# Patient Record
Sex: Female | Born: 1995 | Race: White | Hispanic: No | Marital: Single | Smoking: Never smoker
Health system: Southern US, Community
[De-identification: ages and names within clinical notes are randomized; demographics above are authoritative.]

## PROBLEM LIST (undated history)

## (undated) ENCOUNTER — Ambulatory Visit (HOSPITAL_COMMUNITY): Payer: 59

---
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
## 2019-07-23 ENCOUNTER — Other Ambulatory Visit: Payer: Self-pay

## 2019-07-23 ENCOUNTER — Emergency Department (HOSPITAL_COMMUNITY)
Admission: EM | Admit: 2019-07-23 | Discharge: 2019-07-23 | Discharge status: Against Medical Advice | Payer: 59 | Attending: Emergency Medicine | Admitting: Emergency Medicine

## 2019-07-23 DIAGNOSIS — Z5321 Procedure and treatment not carried out due to patient leaving prior to being seen by health care provider: Secondary | ICD-10-CM | POA: Diagnosis not present

## 2019-07-23 DIAGNOSIS — M549 Dorsalgia, unspecified: Secondary | ICD-10-CM | POA: Diagnosis present

## 2019-07-23 NOTE — ED Notes (Signed)
Pt called for triage x1.  No response.  

## 2019-07-23 NOTE — ED Notes (Signed)
Pt not in lobby.  

## 2019-10-13 ENCOUNTER — Emergency Department (HOSPITAL_COMMUNITY)
Admission: EM | Admit: 2019-10-13 | Discharge: 2019-10-13 | Disposition: A | Discharge status: Other Acute Inpatient Hospital | Payer: 59 | Source: Home / Self Care

## 2019-10-13 ENCOUNTER — Other Ambulatory Visit: Payer: Self-pay

## 2019-10-13 ENCOUNTER — Encounter (HOSPITAL_COMMUNITY): Payer: Self-pay | Admitting: Emergency Medicine

## 2019-10-13 ENCOUNTER — Inpatient Hospital Stay (HOSPITAL_COMMUNITY)
Admission: AD | Admit: 2019-10-13 | Discharge: 2019-10-17 | DRG: 897 | Disposition: A | Discharge status: Home / Self Care | Payer: 59 | Source: Intra-hospital | Attending: Psychiatry | Admitting: Psychiatry

## 2019-10-13 DIAGNOSIS — F1024 Alcohol dependence with alcohol-induced mood disorder: Secondary | ICD-10-CM | POA: Diagnosis present

## 2019-10-13 DIAGNOSIS — F419 Anxiety disorder, unspecified: Secondary | ICD-10-CM | POA: Insufficient documentation

## 2019-10-13 DIAGNOSIS — T543X2A Toxic effect of corrosive alkalis and alkali-like substances, intentional self-harm, initial encounter: Secondary | ICD-10-CM | POA: Insufficient documentation

## 2019-10-13 DIAGNOSIS — G4700 Insomnia, unspecified: Secondary | ICD-10-CM | POA: Diagnosis present

## 2019-10-13 DIAGNOSIS — F332 Major depressive disorder, recurrent severe without psychotic features: Secondary | ICD-10-CM | POA: Diagnosis present

## 2019-10-13 DIAGNOSIS — Z20828 Contact with and (suspected) exposure to other viral communicable diseases: Secondary | ICD-10-CM | POA: Diagnosis present

## 2019-10-13 DIAGNOSIS — T1491XA Suicide attempt, initial encounter: Secondary | ICD-10-CM

## 2019-10-13 DIAGNOSIS — R45851 Suicidal ideations: Secondary | ICD-10-CM | POA: Insufficient documentation

## 2019-10-13 DIAGNOSIS — K219 Gastro-esophageal reflux disease without esophagitis: Secondary | ICD-10-CM | POA: Diagnosis present

## 2019-10-13 DIAGNOSIS — T5492XA Toxic effect of unspecified corrosive substance, intentional self-harm, initial encounter: Secondary | ICD-10-CM

## 2019-10-13 DIAGNOSIS — Y907 Blood alcohol level of 200-239 mg/100 ml: Secondary | ICD-10-CM | POA: Diagnosis present

## 2019-10-13 DIAGNOSIS — F10920 Alcohol use, unspecified with intoxication, uncomplicated: Secondary | ICD-10-CM

## 2019-10-13 LAB — RAPID URINE DRUG SCREEN, HOSP PERFORMED
Amphetamines: NOT DETECTED
Barbiturates: NOT DETECTED
Benzodiazepines: NOT DETECTED
Cocaine: POSITIVE — AB
Opiates: NOT DETECTED
Tetrahydrocannabinol: NOT DETECTED

## 2019-10-13 LAB — ETHANOL: Alcohol, Ethyl (B): 226 mg/dL — ABNORMAL HIGH (ref <10)

## 2019-10-13 LAB — ACETAMINOPHEN LEVEL: Acetaminophen (Tylenol), Serum: <10 ug/mL — ABNORMAL LOW (ref 10–30)

## 2019-10-13 LAB — CBC WITH DIFFERENTIAL/PLATELET
Abs Immature Granulocytes: 0.05 10*3/uL (ref 0.00–0.07)
Basophils Absolute: 0.1 10*3/uL (ref 0.0–0.1)
Basophils Relative: 1 %
Eosinophils Absolute: 0.2 10*3/uL (ref 0.0–0.5)
Eosinophils Relative: 2 %
HCT: 40.3 % (ref 36.0–46.0)
Hemoglobin: 14 g/dL (ref 12.0–15.0)
Immature Granulocytes: 1 %
Lymphocytes Relative: 23 %
Lymphs Abs: 1.9 10*3/uL (ref 0.7–4.0)
MCH: 34.1 pg — ABNORMAL HIGH (ref 26.0–34.0)
MCHC: 34.7 g/dL (ref 30.0–36.0)
MCV: 98.1 fL (ref 80.0–100.0)
Monocytes Absolute: 0.7 10*3/uL (ref 0.1–1.0)
Monocytes Relative: 8 %
Neutro Abs: 5.6 10*3/uL (ref 1.7–7.7)
Neutrophils Relative %: 65 %
Platelets: 211 10*3/uL (ref 150–400)
RBC: 4.11 MIL/uL (ref 3.87–5.11)
RDW: 11.9 % (ref 11.5–15.5)
WBC: 8.5 10*3/uL (ref 4.0–10.5)
nRBC: 0 % (ref 0.0–0.2)

## 2019-10-13 LAB — COMPREHENSIVE METABOLIC PANEL
ALT: 22 U/L (ref 0–44)
AST: 25 U/L (ref 15–41)
Albumin: 4 g/dL (ref 3.5–5.0)
Alkaline Phosphatase: 87 U/L (ref 38–126)
Anion gap: 12 (ref 5–15)
BUN: 11 mg/dL (ref 6–20)
CO2: 23 mmol/L (ref 22–32)
Calcium: 8.7 mg/dL — ABNORMAL LOW (ref 8.9–10.3)
Chloride: 106 mmol/L (ref 98–111)
Creatinine, Ser: 0.82 mg/dL (ref 0.44–1.00)
GFR calc Af Amer: >60 mL/min (ref >60)
GFR calc non Af Amer: >60 mL/min (ref >60)
Glucose, Bld: 98 mg/dL (ref 70–99)
Potassium: 3.5 mmol/L (ref 3.5–5.1)
Sodium: 141 mmol/L (ref 135–145)
Total Bilirubin: 1.1 mg/dL (ref 0.3–1.2)
Total Protein: 7.1 g/dL (ref 6.5–8.1)

## 2019-10-13 LAB — SARS CORONAVIRUS 2 BY RT PCR (HOSPITAL ORDER, PERFORMED IN ~~LOC~~ HOSPITAL LAB): SARS Coronavirus 2: NEGATIVE

## 2019-10-13 LAB — I-STAT BETA HCG BLOOD, ED (MC, WL, AP ONLY): I-stat hCG, quantitative: <5 m[IU]/mL (ref <5)

## 2019-10-13 LAB — SALICYLATE LEVEL: Salicylate Lvl: <7 mg/dL (ref 2.8–30.0)

## 2019-10-13 MED ORDER — FAMOTIDINE 20 MG PO TABS
20.0000 mg | ORAL_TABLET | Freq: Two times a day (BID) | ORAL | Status: DC
  Administered 2019-10-13 (×2): 20 mg via ORAL
  Filled 2019-10-13 (×2): qty 1

## 2019-10-13 MED ORDER — ALUM & MAG HYDROXIDE-SIMETH 200-200-20 MG/5ML PO SUSP
15.0000 mL | Freq: Once | ORAL | Status: AC
  Administered 2019-10-13: 16:00:00 15 mL via ORAL
  Filled 2019-10-13: qty 30

## 2019-10-13 MED ORDER — LORAZEPAM 1 MG PO TABS
1.0000 mg | ORAL_TABLET | Freq: Once | ORAL | Status: AC
  Administered 2019-10-13: 20:00:00 1 mg via ORAL
  Filled 2019-10-13: qty 1

## 2019-10-13 MED ORDER — LORAZEPAM 1 MG PO TABS
1.0000 mg | ORAL_TABLET | Freq: Once | ORAL | Status: AC
  Administered 2019-10-13: 23:00:00 1 mg via ORAL
  Filled 2019-10-13: qty 1

## 2019-10-13 MED ORDER — ACETAMINOPHEN 325 MG PO TABS
650.0000 mg | ORAL_TABLET | Freq: Once | ORAL | Status: AC
  Administered 2019-10-13: 650 mg via ORAL
  Filled 2019-10-13: qty 2

## 2019-10-13 NOTE — ED Notes (Signed)
Mother of patient at SORT. Stated that she called her mother, and stated that she was leaving.

## 2019-10-13 NOTE — Discharge Instructions (Signed)
Admit/transfer to [facility].

## 2019-10-13 NOTE — BH Assessment (Signed)
[facility] Assessment Progress Note Case was staffed with Lord DNP who recommended a inpatient admission to assist with stabilization.       

## 2019-10-13 NOTE — ED Notes (Signed)
Pt on phone with mom

## 2019-10-13 NOTE — ED Provider Notes (Signed)
[facility] EMERGENCY DEPARTMENT Provider Note   CSN: [id number] Arrival date & time: 10/13/19  0524     History   Chief Complaint Chief Complaint - overdose  HPI Margaret Salas is a 23 y.o. female.     The history is provided by the patient.  Drug Overdose This is a new problem. The current episode started 6 to 12 hours ago. The problem has not changed since onset.Pertinent negatives include no chest pain, no abdominal pain, no headaches and no shortness of breath. Nothing aggravates the symptoms. Nothing relieves the symptoms.  Patient presents after excessive alcohol abuse as well as intentional bleach ingestion.  Patient reports she began drinking approximately 4 PM on October 25.  She continued to drink throughout the night, then elected to take a drink of household bleach.  As soon as she swallowed it, she regretted what she did and she induced vomiting.  She now reports a mild sore throat but no other symptoms. She denies any other drug ingestion She reports she has had thoughts of harming self before but this is the first time she has attempted  PMH-none Soc hx - alcohol use Patient Active Problem List   Diagnosis Date Noted  . Sternal fracture 06/10/2016  . Injury of knee, ligament 07/17/2014    History reviewed. No pertinent surgical history.   OB History   No obstetric history on file.      Home Medications    Prior to Admission medications   Medication Sig Start Date End Date Taking? Authorizing Provider  etonogestrel (NEXPLANON) 68 MG IMPL implant Inject 1 each into the skin once.    [provider]    Family History No family history on file.  Social History Social History   Tobacco Use  . Smoking status: Never Smoker  . Smokeless tobacco: Never Used  Substance Use Topics  . Alcohol use: No  . Drug use: No     Allergies   Patient has no known allergies.   Review of Systems Review of Systems  Respiratory:  Negative for shortness of breath.   Cardiovascular: Negative for chest pain.  Gastrointestinal: Negative for abdominal pain.  Neurological: Negative for headaches.  Psychiatric/Behavioral: Positive for suicidal ideas. The patient is nervous/anxious.   All other systems reviewed and are negative.    Physical Exam Updated Vital Signs BP 132/80 (BP Location: Right Arm)   Pulse 95   Temp 98.1 F (36.7 C) (Oral)   Resp (!) 21   SpO2 97%   Physical Exam CONSTITUTIONAL: Mildly anxious, no acute distress HEAD: Normocephalic/atraumatic EYES: EOMI/PERRL ENMT: Mucous membranes moist, uvula midline without exudates, mild erythema, no drooling, no stridor.  No dysphonia NECK: supple no meningeal signs SPINE/BACK:entire spine nontender CV: S1/S2 noted, no murmurs/rubs/gallops noted LUNGS: Lungs are clear to auscultation bilaterally, no apparent distress ABDOMEN: soft, nontender NEURO: Pt is awake/alert/appropriate, moves all extremitiesx4.  No facial droop.   EXTREMITIES: pulses normal/equal, full ROM SKIN: warm, color normal PSYCH: Anxious   ED Treatments / Results  Labs (all labs ordered are listed, but only abnormal results are displayed) Labs Reviewed  CBC WITH DIFFERENTIAL/PLATELET  COMPREHENSIVE METABOLIC PANEL  RAPID URINE DRUG SCREEN, HOSP PERFORMED  ETHANOL  SALICYLATE LEVEL  ACETAMINOPHEN LEVEL  I-STAT BETA HCG BLOOD, ED ([facility], [facility], [facility] ONLY)    EKG EKG Interpretation  Date/Time:  Monday October 13 2019 06:11:25 EDT Ventricular Rate:  90 PR Interval:    QRS Duration: 98 QT Interval:  359  QTC Calculation: 440 R Axis:   68 Text Interpretation:  Sinus rhythm Interpretation limited secondary to artifact No significant change since last tracing Confirmed by Ripley Fraise [phone]) on 10/13/2019 6:18:05 AM   Radiology No results found.  Procedures Procedures   Medications Ordered in ED Medications - No data to display   Initial Impression / Assessment and Plan  / ED Course  I have reviewed the triage vital signs and the nursing notes.  Pertinent labs & imaging results that were available during my care of the patient were reviewed by me and considered in my medical decision making (see chart for details).        5:49 AM Patient presents after binge drinking and then having suicidal thoughts and drinking bleach.  After discussion with poison center, since this was 1 drink of household bleach, she can take p.o. fluids she is likely cleared after getting appropriate screening labs including a 4-hour Tylenol level 6:18 AM Patient was able to drink p.o. fluids. There is not a definitive time when she drank the bleach, but at some point prior to arrival We will check a 4-hour Tylenol level at 8 AM 6:56 AM Signed out to Dr Johnney Killian with labs pending then psych consult.  Final Clinical Impressions(s) / ED Diagnoses   Final diagnoses:  Suicide attempt ([place])  Ingestion of bleach, intentional self-harm, initial encounter [facility])    ED Discharge Orders    None       Ripley Fraise, MD 10/13/19 [phone]

## 2019-10-13 NOTE — ED Notes (Signed)
IVc paperwork given to [facility] for him to fill out.

## 2019-10-13 NOTE — ED Notes (Signed)
Lunch tray ordered, pt given water.

## 2019-10-13 NOTE — ED Notes (Signed)
Notified the MD that the patient is wanting to leave and that her mother is out front trying to take her home.

## 2019-10-13 NOTE — ED Notes (Signed)
Regular breakfast tray ordered.  

## 2019-10-13 NOTE — ED Notes (Signed)
ORDERED DIET TRAY FOR PT  

## 2019-10-13 NOTE — ED Provider Notes (Signed)
[facility] team had evaluated and recommended inpatient psychiatric treatment. Awaiting bed.  I was notified by nursing that patient was wanting/trying to leave ED.   I went and re-evaluated patient x 3 times. Patient acknowledges worsening, severe feelings of depression over course of past few weeks, and that last night, early this AM, suicidal thoughts, drank bleach in attempt to harm self. Also having problems with intermittent binge drinking 'to try to take away the pain of depression'.  I encouraged patient to stay voluntarily, and explained x 3, that in interest of her safety and well-being, that we would have to do ivc paperwork should she not stay voluntarily.   BH was re-consulted to reassess.   While waiting [place] reassessment, patient tried to leave ED.   IVC papers completed given severe depression, SI, and attempt to harm self.   [facility] recontacted - they indicate they have bed available.   COVID swab sent.   Will plan transport to [facility] once covid resulted.      Lajean Saver, MD 10/13/19 2112

## 2019-10-13 NOTE — ED Notes (Signed)
Pt requesting anxiety medication, MD Steinl notified, pt given coloring book and sandwich.

## 2019-10-13 NOTE — ED Notes (Signed)
[employer] called for transport 

## 2019-10-13 NOTE — ED Provider Notes (Signed)
Patient is signed out from Dr. Raechel Chute care.  She drank a small amount of bleach.  She had ingested a large amount of alcohol.  Plan is to observe the patient and review labs.  If no difficulty with swallowing or symptomatic after several hours observation, per poison control safe for medical clearance and psychiatric consultation. Physical Exam  BP 132/80 (BP Location: Right Arm)   Pulse 95   Temp 98.1 F (36.7 C) (Oral)   Resp (!) 21   SpO2 97%   Physical Exam Patient is resting quietly.  As I approach she awakens to light voice.  She is oriented and speech is clear.  Voice is normal.  She has been able to tolerate liquids.  She has had no vomiting.  She does not have ongoing pain. ED Course/Procedures     Procedures  MDM  Patient is medically cleared for psychiatric evaluation.  Psychiatric hold orders placed.       Charlesetta Shanks, MD 10/13/19 1209

## 2019-10-13 NOTE — ED Notes (Signed)
IVC paperwork faxed to [facility]. Original placed in red folder in purple med room.

## 2019-10-13 NOTE — BH Assessment (Addendum)
Assessment Note  Margaret Salas is an 23 y.o. female that presents this date with S/I. Patient was noted to have ingested bleach prior to arrival in an attempt to self harm. Patient reports this was an attempt at taking her life. Patient denies any prior attempts or gestures. Patient denies any H/I or AVH. Patient cannot identify any immediate stressors stating "it's everything." Patient reports she is currently unemployed although states she is suppose to start a new job later this week at "[employer] Patient denies any prior history of inpatient hospitalizations associated with mental health or having a current OP provider. Patient states she was diagnosed with depression "years ago" by her PCP although denies any past or current medication interventions to assist with symptom management. Patient reports ongoing symptoms to include: feeling useless, excessive fatigue and isolating. Patient states she currently resides with a roommate and lacks social supports in the area. Patient denies any access to weapons or current legal issues. Patient reports ongoing SA issues to include daily use of alcohol for the last six months stating she consumes up to one half pint of liquor with last use on 10/12/19 when she reported she consumed "a few drinks." Patient denies any other SA use although tested positive for cocaine this date. Patient denies any current withdrawals. History is limited per chart review. Per notes this date patient presents after excessive alcohol abuse as well as intentional bleach ingestion. Patient reports she began drinking approximately 4 PM on October 25.  She continued to drink throughout the night, then elected to take a drink of household bleach. As soon as she swallowed it, she regretted what she did and she induced vomiting. She reports she has had thoughts of harming self before but this is the first time she has attempted. Patient's BAL was 226 on arrival.  Patient is dressed in  hospital scrubs, alert and oriented person and place. Patient speaks in a clear tone, at moderate volume and normal pace. Motor behavior appears normal. Eye contact is staring. Patient's mood is depressed and affect is blunted. Thought process is organized although insight and judgment are poor. Patient says she is willing to sign voluntarily into a psychiatric facility. Case was staffed with Shaune PollackLord DNP who recommended a inpatient admission to assist with stabilization.      Diagnosis: F33.2 MDD recurrent without psychotic features, severe, Alcohol abuse, Cocaine abuse  Past Medical History: History reviewed. No pertinent past medical history.  History reviewed. No pertinent surgical history.  Family History: No family history on file.  Social History:  reports that she has never smoked. She has never used smokeless tobacco. She reports that she does not drink alcohol or use drugs.  Additional Social History:  Alcohol / Drug Use Pain Medications: See MAR Prescriptions: See MAR Over the Counter: See MAR History of alcohol / drug use?: Yes Longest period of sobriety (when/how long): Unknown Negative Consequences of Use: (Denies) Withdrawal Symptoms: (Denies) Substance #1 Name of Substance 1: Alcohol 1 - Age of First Use: 18 1 - Amount (size/oz): Varies 1 - Frequency: Varies 1 - Duration: Ongoing 1 - Last Use / Amount: 10/12/19 1 pint of liquor Substance #2 Name of Substance 2: Cocaine 2 - Age of First Use: 22 2 - Amount (size/oz): Varies 2 - Frequency: Varies 2 - Duration: Ongoing 2 - Last Use / Amount: 10/12/19 1 gram  CIWA: CIWA-Ar BP: 129/60 Pulse Rate: 66 COWS:    Allergies: No Known Allergies  Home Medications: (  Not in a hospital admission)   OB/GYN Status:  No LMP recorded. Patient has had an implant.  General Assessment Data Location of Assessment: [facility] ED TTS Assessment: In system Is this a Tele or Face-to-Face Assessment?: Face-to-Face Is this an Initial  Assessment or a Re-assessment for this encounter?: Initial Assessment Patient Accompanied by:: N/A Language Other than English: No Living Arrangements: Other (Comment) What gender do you identify as?: Female Marital status: Single Maiden name: Rettig Pregnancy Status: No Living Arrangements: Non-relatives/Friends Can pt return to current living arrangement?: Yes Admission Status: Voluntary Is patient capable of signing voluntary admission?: Yes Referral Source: Self/Family/Friend Insurance type: Research officer, trade union Exam [facility] Walk-in ONLY) Medical Exam completed: Yes  Crisis Care Plan Living Arrangements: Non-relatives/Friends Legal Guardian: (NA) Name of Psychiatrist: None Name of Therapist: None  Education Status Is patient currently in school?: No Is the patient employed, unemployed or receiving disability?: Unemployed  Risk to self with the past 6 months Suicidal Ideation: Yes-Currently Present Has patient been a risk to self within the past 6 months prior to admission? : No Suicidal Intent: Yes-Currently Present Has patient had any suicidal intent within the past 6 months prior to admission? : No Is patient at risk for suicide?: Yes Suicidal Plan?: Yes-Currently Present Has patient had any suicidal plan within the past 6 months prior to admission? : No Specify Current Suicidal Plan: Drink bleach Access to Means: Yes Specify Access to Suicidal Means: Pt drank bleach What has been your use of drugs/alcohol within the last 12 months?: Current use Previous Attempts/Gestures: No How many times?: 0 Other Self Harm Risks: (Excessive SA use) Triggers for Past Attempts: (NA) Intentional Self Injurious Behavior: None Family Suicide History: No Recent stressful life event(s): Other (Comment)(Ongoing SA issues) Persecutory voices/beliefs?: No Depression: Yes Depression Symptoms: Feeling worthless/self pity Substance abuse history and/or treatment for  substance abuse?: No Suicide prevention information given to non-admitted patients: Not applicable  Risk to Others within the past 6 months Homicidal Ideation: No Does patient have any lifetime risk of violence toward others beyond the six months prior to admission? : No Thoughts of Harm to Others: No Current Homicidal Intent: No Current Homicidal Plan: No Access to Homicidal Means: No Identified Victim: NA History of harm to others?: No Assessment of Violence: None Noted Violent Behavior Description: NA Does patient have access to weapons?: No Criminal Charges Pending?: No Does patient have a court date: No Is patient on probation?: No  Psychosis Hallucinations: None noted Delusions: None noted  Mental Status Report Appearance/Hygiene: Unremarkable Eye Contact: Fair Motor Activity: Freedom of movement Speech: Logical/coherent Level of Consciousness: Quiet/awake Mood: Depressed Affect: Appropriate to circumstance Anxiety Level: Minimal Thought Processes: Coherent, Relevant Judgement: Partial Orientation: Person, Place, Time Obsessive Compulsive Thoughts/Behaviors: None  Cognitive Functioning Concentration: Normal Memory: Recent Intact, Remote Intact Is patient IDD: No Insight: Fair Impulse Control: Poor Appetite: Fair Have you had any weight changes? : No Change Sleep: No Change Total Hours of Sleep: 7 Vegetative Symptoms: None  ADLScreening [facility] Assessment Services) Patient's cognitive ability adequate to safely complete daily activities?: Yes Patient able to express need for assistance with ADLs?: Yes Independently performs ADLs?: Yes (appropriate for developmental age)  Prior Inpatient Therapy Prior Inpatient Therapy: No  Prior Outpatient Therapy Prior Outpatient Therapy: No Does patient have an ACCT team?: No Does patient have Intensive In-House Services?  : No Does patient have [facility] services? : No Does patient have P4CC services?: No  ADL  Screening (condition at time  of admission) Patient's cognitive ability adequate to safely complete daily activities?: Yes Is the patient deaf or have difficulty hearing?: No Does the patient have difficulty seeing, even when wearing glasses/contacts?: No Does the patient have difficulty concentrating, remembering, or making decisions?: No Patient able to express need for assistance with ADLs?: Yes Does the patient have difficulty dressing or bathing?: No Independently performs ADLs?: Yes (appropriate for developmental age) Does the patient have difficulty walking or climbing stairs?: No Weakness of Legs: None Weakness of Arms/Hands: None  Home Assistive Devices/Equipment Home Assistive Devices/Equipment: None  Therapy Consults (therapy consults require a physician order) PT Evaluation Needed: No OT Evalulation Needed: No SLP Evaluation Needed: No Abuse/Neglect Assessment (Assessment to be complete while patient is alone) Abuse/Neglect Assessment Can Be Completed: Yes Physical Abuse: Denies Verbal Abuse: Denies Sexual Abuse: Denies Exploitation of patient/patient's resources: Denies Self-Neglect: Denies Values / Beliefs Cultural Requests During Hospitalization: None Spiritual Requests During Hospitalization: None Consults Spiritual Care Consult Needed: No Social Work Consult Needed: No Regulatory affairs officer (For Healthcare) Does Patient Have a Medical Advance Directive?: No Would patient like information on creating a medical advance directive?: No - Patient declined          Disposition: Case was staffed with Reita Cliche DNP who recommended a inpatient admission to assist with stabilization.      Disposition Initial Assessment Completed for this Encounter: Yes Disposition of Patient: Admit Type of inpatient treatment program: Adult Patient refused recommended treatment: No  On Site Evaluation by:   Reviewed with Physician:    Mamie Nick 10/13/2019 1:38 PM

## 2019-10-13 NOTE — ED Notes (Signed)
Pt attempted to leave and was brought back by security and [employer], pt informed that she is IVC'd, pt very tearful but cooperative, pt informed that she has a bed at [facility] pending her COVID test.  [employer] and sitter to remain near bed side. Will continue to monitor. Mother left premises after security informed her she could no longer stay.

## 2019-10-13 NOTE — ED Notes (Signed)
pts belongings are in locker #4. Wallet and cell phone were given to security.

## 2019-10-13 NOTE — ED Notes (Signed)
Her mom brought her a card

## 2019-10-13 NOTE — ED Notes (Signed)
Sheriff here to transport 

## 2019-10-13 NOTE — BH Assessment (Signed)
Patient accepted to [facility] pending a negative COVID test. The room assignment is 301-1. Patient accepted by Waylan Boga, NP. The attending provider is Dr. Parke Poisson. Nursing report [phone].

## 2019-10-13 NOTE — ED Notes (Signed)
REGULAR LUNCH TRAY ORDERED 

## 2019-10-13 NOTE — ED Triage Notes (Signed)
Pt here after drinking all day and decided to drink some bleach in an SI attempt , pt has a hx of depression but does not take meds

## 2019-10-13 NOTE — ED Notes (Signed)
Spoke with poison control, no further recommendations given at this time.

## 2019-10-13 NOTE — ED Notes (Signed)
MD Steinl aware IVC paperwork needs to be completed.

## 2019-10-14 ENCOUNTER — Encounter (HOSPITAL_COMMUNITY): Payer: Self-pay | Admitting: *Deleted

## 2019-10-14 ENCOUNTER — Other Ambulatory Visit: Payer: Self-pay

## 2019-10-14 DIAGNOSIS — F1024 Alcohol dependence with alcohol-induced mood disorder: Secondary | ICD-10-CM | POA: Diagnosis not present

## 2019-10-14 DIAGNOSIS — T1491XA Suicide attempt, initial encounter: Secondary | ICD-10-CM

## 2019-10-14 DIAGNOSIS — T5492XA Toxic effect of unspecified corrosive substance, intentional self-harm, initial encounter: Secondary | ICD-10-CM

## 2019-10-14 DIAGNOSIS — F332 Major depressive disorder, recurrent severe without psychotic features: Secondary | ICD-10-CM | POA: Diagnosis not present

## 2019-10-14 LAB — LIPID PANEL
Cholesterol: 187 mg/dL (ref 0–200)
HDL: 99 mg/dL (ref >40)
LDL Cholesterol: 67 mg/dL (ref 0–99)
Total CHOL/HDL Ratio: 1.9 RATIO
Triglycerides: 104 mg/dL (ref <150)
VLDL: 21 mg/dL (ref 0–40)

## 2019-10-14 LAB — TSH: TSH: 2.038 u[IU]/mL (ref 0.350–4.500)

## 2019-10-14 LAB — HEMOGLOBIN A1C
Hgb A1c MFr Bld: 4.5 % — ABNORMAL LOW (ref 4.8–5.6)
Mean Plasma Glucose: 82.45 mg/dL

## 2019-10-14 MED ORDER — FAMOTIDINE 20 MG PO TABS
20.0000 mg | ORAL_TABLET | Freq: Two times a day (BID) | ORAL | Status: DC
  Administered 2019-10-14 – 2019-10-17 (×7): 20 mg via ORAL
  Filled 2019-10-14 (×11): qty 1

## 2019-10-14 MED ORDER — ADULT MULTIVITAMIN W/MINERALS CH
1.0000 | ORAL_TABLET | Freq: Every day | ORAL | Status: DC
  Administered 2019-10-14 – 2019-10-17 (×4): 1 via ORAL
  Filled 2019-10-14 (×6): qty 1

## 2019-10-14 MED ORDER — ACETAMINOPHEN 325 MG PO TABS: 650.0000 mg | ORAL_TABLET | Freq: Four times a day (QID) | ORAL | Status: DC | PRN

## 2019-10-14 MED ORDER — VITAMIN B-1 100 MG PO TABS
100.0000 mg | ORAL_TABLET | Freq: Every day | ORAL | Status: DC
  Administered 2019-10-15 – 2019-10-17 (×3): 100 mg via ORAL
  Filled 2019-10-14 (×5): qty 1

## 2019-10-14 MED ORDER — CHLORDIAZEPOXIDE HCL 25 MG PO CAPS
25.0000 mg | ORAL_CAPSULE | ORAL | Status: AC
  Administered 2019-10-16 (×2): 25 mg via ORAL
  Filled 2019-10-14 (×2): qty 1

## 2019-10-14 MED ORDER — CHLORDIAZEPOXIDE HCL 25 MG PO CAPS
25.0000 mg | ORAL_CAPSULE | Freq: Four times a day (QID) | ORAL | Status: AC
  Administered 2019-10-14 (×4): 25 mg via ORAL
  Filled 2019-10-14 (×4): qty 1

## 2019-10-14 MED ORDER — CHLORDIAZEPOXIDE HCL 25 MG PO CAPS: 25.0000 mg | ORAL_CAPSULE | Freq: Four times a day (QID) | ORAL | Status: AC | PRN

## 2019-10-14 MED ORDER — ONDANSETRON 4 MG PO TBDP: 4.0000 mg | ORAL_TABLET | Freq: Four times a day (QID) | ORAL | Status: AC | PRN

## 2019-10-14 MED ORDER — LOPERAMIDE HCL 2 MG PO CAPS: 2.0000 mg | ORAL_CAPSULE | ORAL | Status: AC | PRN

## 2019-10-14 MED ORDER — HYDROXYZINE HCL 25 MG PO TABS
25.0000 mg | ORAL_TABLET | Freq: Four times a day (QID) | ORAL | Status: AC | PRN
  Administered 2019-10-15 (×2): 25 mg via ORAL
  Filled 2019-10-14 (×2): qty 1

## 2019-10-14 MED ORDER — TRAZODONE HCL 50 MG PO TABS
50.0000 mg | ORAL_TABLET | Freq: Every evening | ORAL | Status: DC | PRN
  Administered 2019-10-14 – 2019-10-16 (×4): 50 mg via ORAL
  Filled 2019-10-14 (×4): qty 1

## 2019-10-14 MED ORDER — CHLORDIAZEPOXIDE HCL 25 MG PO CAPS
25.0000 mg | ORAL_CAPSULE | Freq: Three times a day (TID) | ORAL | Status: AC
  Administered 2019-10-15 (×3): 25 mg via ORAL
  Filled 2019-10-14 (×3): qty 1

## 2019-10-14 MED ORDER — FLUOXETINE HCL 10 MG PO CAPS
10.0000 mg | ORAL_CAPSULE | Freq: Every day | ORAL | Status: DC
  Administered 2019-10-14 – 2019-10-15 (×2): 10 mg via ORAL
  Filled 2019-10-14 (×5): qty 1

## 2019-10-14 MED ORDER — ALUM & MAG HYDROXIDE-SIMETH 200-200-20 MG/5ML PO SUSP: 30.0000 mL | ORAL | Status: DC | PRN

## 2019-10-14 MED ORDER — HYDROXYZINE HCL 25 MG PO TABS: 25.0000 mg | ORAL_TABLET | Freq: Three times a day (TID) | ORAL | Status: DC | PRN

## 2019-10-14 MED ORDER — CHLORDIAZEPOXIDE HCL 25 MG PO CAPS
25.0000 mg | ORAL_CAPSULE | Freq: Every day | ORAL | Status: AC
  Administered 2019-10-17: 25 mg via ORAL
  Filled 2019-10-14: qty 1

## 2019-10-14 MED ORDER — MAGNESIUM HYDROXIDE 400 MG/5ML PO SUSP: 30.0000 mL | Freq: Every day | ORAL | Status: DC | PRN
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
## 2019-10-14 NOTE — Progress Notes (Signed)
Patient shared in group that her positive event for the day is that she had a chance to get to know her peers. Her goal for tomorrow is that she would like to work on her discharge plans.
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
## 2019-10-14 NOTE — Progress Notes (Signed)
Pt was pleasant and cooperative during the adm process. Per note this is the pt's first inpatient admission. When asked about family history pt stated, "my whole family are alcoholics". Pt denies SI now, but drank bleach several hours prior to her bhh admission. Pt's BAL was 256, stated she drinks almost every day. Writer spoke to the NP and pt will be started on a detox protocol. Pt denies SI, HI, and A/V at this time

## 2019-10-14 NOTE — Tx Team (Signed)
Initial Treatment Plan 10/14/2019 1:49 AM Margaret Salas [facility]:[id number]    PATIENT STRESSORS: Financial difficulties Occupational concerns Substance abuse Other: Mother drinks too much   PATIENT STRENGTHS: Ability for insight Active sense of humor Average or above average intelligence Capable of independent living Communication skills General fund of knowledge Motivation for treatment/growth Physical Health Special hobby/interest Supportive family/friends Work skills   PATIENT IDENTIFIED PROBLEMS: "Like learn how to cope with other ways than drinking"    "Learn how to control depression, other than let it break you down"    Increased risk for suicide  Substance abuse           DISCHARGE CRITERIA:  Ability to meet basic life and health needs Adequate post-discharge living arrangements Improved stabilization in mood, thinking, and/or behavior Motivation to continue treatment in a less acute level of care Need for constant or close observation no longer present Reduction of life-threatening or endangering symptoms to within safe limits Safe-care adequate arrangements made Verbal commitment to aftercare and medication compliance Withdrawal symptoms are absent or subacute and managed without 24-hour nursing intervention  PRELIMINARY DISCHARGE PLAN: Attend 12-step recovery group Outpatient therapy Participate in family therapy Placement in alternative living arrangements Return to previous living arrangement  PATIENT/FAMILY INVOLVEMENT: This treatment plan has been presented to and reviewed with the patient, Margaret Salas, and/or family member.  The patient and family have been given the opportunity to ask questions and make suggestions.  Doran Heater, RN 10/14/2019, 1:49 AM

## 2019-10-14 NOTE — Progress Notes (Signed)
DAR Note:  D - Pt denies SI, HI, A/V Hallucinations. Pt is alert and oriented x 4.  Pt states that she is depressed and anxious.  Pt rates anxiety at a 7 out of 10. Pt reports that she slept well last night and that Trazodone was effective.  Pt states that her goal for today  is to "relax" and "not stress".   A - RN provided emotional support and reassurance. RN provided education to pt regarding pt's medications.   RN administered pt's medications as prescribed.  RN maintained safety protocols with q 15 minute safety checks.  RN asked pt to notify staff if pt is in need of assistance.  R - Pt is interacting well with her peers, and she  is seen smiling throughout the day thus far. Pt states that librium is helping pt to feel less anxious.   Pt reports that she has no needs or concerns at this time.
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
## 2019-10-14 NOTE — [facility] Suicide Risk Assessment (Signed)
[place] INPATIENT:  Family/Significant Other Suicide Prevention Education  Suicide Prevention Education:  Patient Refusal for Family/Significant Other Suicide Prevention Education: The patient Margaret Salas has refused to provide written consent for family/significant other to be provided Family/Significant Other Suicide Prevention Education during admission and/or prior to discharge.  Physician notified.  SPE completed with pt, as pt refused to consent to family contact. SPI pamphlet provided to pt and pt was encouraged to share information with support network, ask questions, and talk about any concerns relating to SPE. Pt denies access to guns/firearms and verbalized understanding of information provided. Mobile Crisis information also provided to pt.   Rozann Lesches 10/14/2019, 10:12 AM

## 2019-10-15 DIAGNOSIS — F332 Major depressive disorder, recurrent severe without psychotic features: Secondary | ICD-10-CM | POA: Diagnosis not present

## 2019-10-15 DIAGNOSIS — F1024 Alcohol dependence with alcohol-induced mood disorder: Secondary | ICD-10-CM | POA: Diagnosis not present

## 2019-10-15 DIAGNOSIS — T5492XA Toxic effect of unspecified corrosive substance, intentional self-harm, initial encounter: Secondary | ICD-10-CM | POA: Diagnosis not present

## 2019-10-15 DIAGNOSIS — T1491XA Suicide attempt, initial encounter: Secondary | ICD-10-CM | POA: Diagnosis not present

## 2019-10-15 MED ORDER — NICOTINE 7 MG/24HR TD PT24
7.0000 mg | MEDICATED_PATCH | Freq: Every day | TRANSDERMAL | Status: DC
  Administered 2019-10-15 – 2019-10-16 (×2): 7 mg via TRANSDERMAL
  Filled 2019-10-15 (×6): qty 1

## 2019-10-15 MED ORDER — ACAMPROSATE CALCIUM 333 MG PO TBEC
666.0000 mg | DELAYED_RELEASE_TABLET | Freq: Three times a day (TID) | ORAL | Status: DC
  Administered 2019-10-15 – 2019-10-17 (×7): 666 mg via ORAL
  Filled 2019-10-15 (×12): qty 2

## 2019-10-15 MED ORDER — FLUOXETINE HCL 20 MG PO CAPS
20.0000 mg | ORAL_CAPSULE | Freq: Every day | ORAL | Status: DC
  Administered 2019-10-16 – 2019-10-17 (×2): 20 mg via ORAL
  Filled 2019-10-15 (×4): qty 1

## 2019-10-15 NOTE — Progress Notes (Signed)
D:  Patient denied SI and HI, contracts for safety.  Denied A/V hallucinations.   A:  Medications administered per MD orders.  Emotional support and encouragement given patient. R:  Safety maintained with 15 minute checks.  

## 2019-10-15 NOTE — Tx Team (Signed)
Interdisciplinary Treatment and Diagnostic Plan Update  10/15/2019 Time of Session: 9:00am Margaret Salas MRN: [id number]  Principal Diagnosis: <principal problem not specified>  Secondary Diagnoses: Active Problems:   Severe recurrent major depression without psychotic features ([place])   Alcohol dependence with alcohol-induced mood disorder ([facility])   Current Medications:  Current Facility-Administered Medications  Medication Dose Route Frequency Provider Last Rate Last Dose  . acetaminophen (TYLENOL) tablet 650 mg  650 mg Oral Q6H PRN Rozetta Nunnery, NP      . alum & mag hydroxide-simeth (MAALOX/MYLANTA) 200-200-20 MG/5ML suspension 30 mL  30 mL Oral Q4H PRN Lindon Romp A, NP      . chlordiazePOXIDE (LIBRIUM) capsule 25 mg  25 mg Oral Q6H PRN Lindon Romp A, NP      . chlordiazePOXIDE (LIBRIUM) capsule 25 mg  25 mg Oral TID Lindon Romp A, NP   25 mg at 10/15/19 0900   Followed by  . [START ON 10/16/2019] chlordiazePOXIDE (LIBRIUM) capsule 25 mg  25 mg Oral BH-qamhs Rozetta Nunnery, NP       Followed by  . [START ON 10/17/2019] chlordiazePOXIDE (LIBRIUM) capsule 25 mg  25 mg Oral Daily Lindon Romp A, NP      . famotidine (PEPCID) tablet 20 mg  20 mg Oral BID Lindon Romp A, NP   20 mg at 10/15/19 0900  . FLUoxetine (PROZAC) capsule 10 mg  10 mg Oral Daily Connye Burkitt, NP   10 mg at 10/15/19 0900  . hydrOXYzine (ATARAX/VISTARIL) tablet 25 mg  25 mg Oral Q6H PRN Lindon Romp A, NP      . loperamide (IMODIUM) capsule 2-4 mg  2-4 mg Oral PRN Lindon Romp A, NP      . magnesium hydroxide (MILK OF MAGNESIA) suspension 30 mL  30 mL Oral Daily PRN Lindon Romp A, NP      . multivitamin with minerals tablet 1 tablet  1 tablet Oral Daily Lindon Romp A, NP   1 tablet at 10/15/19 0900  . ondansetron (ZOFRAN-ODT) disintegrating tablet 4 mg  4 mg Oral Q6H PRN Lindon Romp A, NP      . thiamine (VITAMIN B-1) tablet 100 mg  100 mg Oral Daily Lindon Romp A, NP   100 mg at 10/15/19 0900  . traZODone  (DESYREL) tablet 50 mg  50 mg Oral QHS PRN Rozetta Nunnery, NP   50 mg at 10/14/19 2157   PTA Medications: Medications Prior to Admission  Medication Sig Dispense Refill Last Dose  . etonogestrel (NEXPLANON) 68 MG IMPL implant Inject 1 each into the skin once.       Patient Stressors: Financial difficulties Occupational concerns Substance abuse Other: Mother drinks too much  Patient Strengths: Ability for insight Active sense of humor Average or above average intelligence Capable of independent living Communication skills General fund of knowledge Motivation for treatment/growth Physical Health Special hobby/interest Supportive family/friends Work skills  Treatment Modalities: Medication Management, Group therapy, Case management,  1 to 1 session with clinician, Psychoeducation, Recreational therapy.   Physician Treatment Plan for Primary Diagnosis: <principal problem not specified> Long Term Goal(s): Improvement in symptoms so as ready for discharge Improvement in symptoms so as ready for discharge   Short Term Goals: Ability to identify changes in lifestyle to reduce recurrence of condition will improve Ability to verbalize feelings will improve Ability to disclose and discuss suicidal ideas Ability to demonstrate self-control will improve Ability to identify and develop effective coping behaviors will improve  Medication Management: Evaluate patient's response, side effects, and tolerance of medication regimen.  Therapeutic Interventions: 1 to 1 sessions, Unit Group sessions and Medication administration.  Evaluation of Outcomes: Not Met  Physician Treatment Plan for Secondary Diagnosis: Active Problems:   Severe recurrent major depression without psychotic features ([place])   Alcohol dependence with alcohol-induced mood disorder ([place])  Long Term Goal(s): Improvement in symptoms so as ready for discharge Improvement in symptoms so as ready for discharge   Short Term  Goals: Ability to identify changes in lifestyle to reduce recurrence of condition will improve Ability to verbalize feelings will improve Ability to disclose and discuss suicidal ideas Ability to demonstrate self-control will improve Ability to identify and develop effective coping behaviors will improve     Medication Management: Evaluate patient's response, side effects, and tolerance of medication regimen.  Therapeutic Interventions: 1 to 1 sessions, Unit Group sessions and Medication administration.  Evaluation of Outcomes: Not Met   RN Treatment Plan for Primary Diagnosis: <principal problem not specified> Long Term Goal(s): Knowledge of disease and therapeutic regimen to maintain health will improve  Short Term Goals: Ability to participate in decision making will improve, Ability to verbalize feelings will improve, Ability to disclose and discuss suicidal ideas, Ability to identify and develop effective coping behaviors will improve and Compliance with prescribed medications will improve  Medication Management: RN will administer medications as ordered by provider, will assess and evaluate patient's response and provide education to patient for prescribed medication. RN will report any adverse and/or side effects to prescribing provider.  Therapeutic Interventions: 1 on 1 counseling sessions, Psychoeducation, Medication administration, Evaluate responses to treatment, Monitor vital signs and CBGs as ordered, Perform/monitor CIWA, COWS, AIMS and Fall Risk screenings as ordered, Perform wound care treatments as ordered.  Evaluation of Outcomes: Not Met   LCSW Treatment Plan for Primary Diagnosis: <principal problem not specified> Long Term Goal(s): Safe transition to appropriate next level of care at discharge, Engage patient in therapeutic group addressing interpersonal concerns.  Short Term Goals: Engage patient in aftercare planning with referrals and resources  Therapeutic  Interventions: Assess for all discharge needs, 1 to 1 time with Social worker, Explore available resources and support systems, Assess for adequacy in community support network, Educate family and significant other(s) on suicide prevention, Complete Psychosocial Assessment, Interpersonal group therapy.  Evaluation of Outcomes: Not Met   Progress in Treatment: Attending groups: No. Participating in groups: No. Taking medication as prescribed: Yes. Toleration medication: Yes. Family/Significant other contact made: No, will contact:  no one, patient denies consent Patient understands diagnosis: Yes. Discussing patient identified problems/goals with staff: Yes. Medical problems stabilized or resolved: Yes. Denies suicidal/homicidal ideation: Yes. Issues/concerns per patient self-inventory: No. Other:  New problem(s) identified: None   New Short Term/Long Term Goal(s):Detox, medication stabilization, elimination of SI thoughts, development of comprehensive mental wellness plan.    Patient Goals:  "Trying to get sober and not be depressed"   Discharge Plan or Barriers: Patient plans to return home with her best friend. She expressed interest in outpatient medication management and therapy services at [facility] at discharge. CSW will continue to follow for appropriate referrals and discharge planning.   Reason for Continuation of Hospitalization: Anxiety Depression Medication stabilization Suicidal ideation  Estimated Length of Stay:3-5 days   Attendees: Patient: Margaret Salas  10/15/2019 9:38 AM  Physician: Dr. Neita Garnet, MD 10/15/2019 9:38 AM  Nursing: Rise Paganini.Raliegh Ip, RN 10/15/2019 9:38 AM  RN Care Manager: 10/15/2019 9:38 AM  Social  Worker: Radonna Ricker, LCSW 10/15/2019 9:38 AM  Recreational Therapist:  10/15/2019 9:38 AM  Other: Harriett Sine, NP 10/15/2019 9:38 AM  Other:  10/15/2019 9:38 AM  Other: 10/15/2019 9:38 AM    Scribe for Treatment Team: Marylee Floras, [place] 10/15/2019 9:38 AM

## 2019-10-15 NOTE — Progress Notes (Signed)
Recreation Therapy Notes  Date:  10.28.20 Time: 0930 Location: [address] Dayroom  Group Topic: Stress Management  Goal Area(s) Addresses:  Patient will identify positive stress management techniques. Patient will identify benefits of using stress management post d/c.  Intervention: Stress Management  Activity :  Progressive Muscle Relaxation.  LRT introduced the stress management technique of progressive muscle relaxation.  LRT lead patients through the process of tensing and releasing each muscle group individually.  Patients were to follow along to engage in activity.  Education:  Stress Management, Discharge Planning.   Education Outcome: Acknowledges Education  Clinical Observations/Feedback: Pt did not attend activity.    Victorino Sparrow, LRT/CTRS         Victorino Sparrow A 10/15/2019 12:18 PM

## 2019-10-15 NOTE — [facility] Group Notes (Signed)
Adult Psychoeducational Group Note  Date:  10/15/2019 Time:  10:00 PM  Group Topic/Focus:  Wrap-Up Group:   The focus of this group is to help patients review their daily goal of treatment and discuss progress on daily workbooks.  Participation Level:  Active  Participation Quality:  Appropriate  Affect:  Appropriate  Cognitive:  Appropriate  Insight: Appropriate  Engagement in Group:  Engaged  Modes of Intervention:  Discussion  Additional Comments:  Patient attended and participated in the wrap-up group.  Margaret Salas 10/15/2019, 10:00 PM

## 2019-10-15 NOTE — Progress Notes (Signed)
D: Patient denies SI, HI or AVH this evening. Patient presents as flat and depressed but active in the milieu.  Pt. Attended evening wrap up group and discussed the positive part of her day a getting to know her peers and her goal for tomorrow is to work on her discharge plan.  Pt. Denies any physical complaints and offered no new concerns.  A: Patient given emotional support from RN. Patient encouraged to come to staff with concerns and/or questions. Patient's medication routine continued. Patient's orders and plan of care reviewed.   R: Patient remains appropriate and cooperative. Will continue to monitor patient q15 minutes for safety.

## 2019-10-15 NOTE — Progress Notes (Addendum)
[facility] MD Progress Note  10/15/2019 11:22 AM Margaret Salas  MRN:  [id number]   Subjective:  "I feel pretty good. I keep waking up with cold sweats. Its probably the alcohol."  Margaret Salas is a 23 y.o. female who has a history of depression and alcohol use disorder. She presented to the emergency department after impulsively drinking bleach in a suicide attempt. She immediately regretted the decision and induced vomiting. She reported feeling severely depressed for 4 to 5 months and drinking alcohol daily to help cope. BAL in the ED was 226.   Patient reports that she started out drinking on weekends with a friend which then progressed to drinking daily by herself. Patient states Patient reports that she used to play volleyball all the time but has lost interest over the past year and now only plays occasionally. States that drinking alcohol also interfered with her playing volleyball.   Patient reports that she continues to have cold sweats, anxiety, and a mild tremor.  She states that she is not interested in residential treatment and would like to participate in outpatient substance abuse treatment and AA after discharge. Discussed risks/benefits of starting Campral for alcohol craving. Patient is in agreement with starting Campral. Patient is on day 2 of tapering librium CIWA protocol.   On evaluation the patient is alert and oriented x 4, pleasant, and cooperative. Speech is clear and coherent, normal pace, normal volume. Eye contact is good. Mood is depressed and anxious. Affect is congruent with mood. She is future oriented. Thought process is coherent, linear, and descriptions of associations are intact. She denies AVH and paranoia. No indication that the patient is responding to internal stimuli.No delusional thought content noted. Denies current suicidal thoughts. Denies HI.     Principal Problem: <principal problem not specified> Diagnosis: Active Problems:   Severe recurrent major  depression without psychotic features ([place])   Alcohol dependence with alcohol-induced mood disorder ([place])  Total Time spent with patient: 20 minutes  Past Psychiatric History: History of depression with self-injurious behaviors (cutting) during middle school. History of binge drinking. Denies history of hospitalizations or suicide attempts. Denies prior psychotropic medication trials or mental health treatment.   Past Medical History: History reviewed. No pertinent past medical history. History reviewed. No pertinent surgical history. Family History: History reviewed. No pertinent family history. Family Psychiatric  History: History of alcohol use disorder in multiple relatives on maternal side. Mother with alcohol use disorder and still drinking heavily. Father with alcohol use disorder with 6 years of sobriety. Social History:  Social History   Substance and Sexual Activity  Alcohol Use Yes  . Alcohol/week: 10.0 standard drinks  . Types: 10 Shots of liquor per week     Social History   Substance and Sexual Activity  Drug Use No    Social History   Socioeconomic History  . Marital status: Single    Spouse name: Not on file  . Number of children: Not on file  . Years of education: Not on file  . Highest education level: Not on file  Occupational History  . Not on file  Social Needs  . Financial resource strain: Not on file  . Food insecurity    Worry: Not on file    Inability: Not on file  . Transportation needs    Medical: Not on file    Non-medical: Not on file  Tobacco Use  . Smoking status: Never Smoker  . Smokeless tobacco: Never Used  Substance and Sexual  Activity  . Alcohol use: Yes    Alcohol/week: 10.0 standard drinks    Types: 10 Shots of liquor per week  . Drug use: No  . Sexual activity: Not on file  Lifestyle  . Physical activity    Days per week: Not on file    Minutes per session: Not on file  . Stress: Not on file  Relationships  . Social  Musician on phone: Not on file    Gets together: Not on file    Attends religious service: Not on file    Active member of club or organization: Not on file    Attends meetings of clubs or organizations: Not on file    Relationship status: Not on file  Other Topics Concern  . Not on file  Social History Narrative  . Not on file   Additional Social History:                         Sleep: Fair  Appetite:  Fair  Current Medications: Current Facility-Administered Medications  Medication Dose Route Frequency Provider Last Rate Last Dose  . acamprosate (CAMPRAL) tablet 666 mg  666 mg Oral TID WC Cobos, Rockey Situ, MD      . acetaminophen (TYLENOL) tablet 650 mg  650 mg Oral Q6H PRN Jackelyn Poling, NP      . alum & mag hydroxide-simeth (MAALOX/MYLANTA) 200-200-20 MG/5ML suspension 30 mL  30 mL Oral Q4H PRN Nira Conn A, NP      . chlordiazePOXIDE (LIBRIUM) capsule 25 mg  25 mg Oral Q6H PRN Nira Conn A, NP      . chlordiazePOXIDE (LIBRIUM) capsule 25 mg  25 mg Oral TID Nira Conn A, NP   25 mg at 10/15/19 0900   Followed by  . [START ON 10/16/2019] chlordiazePOXIDE (LIBRIUM) capsule 25 mg  25 mg Oral BH-qamhs Jackelyn Poling, NP       Followed by  . [START ON 10/17/2019] chlordiazePOXIDE (LIBRIUM) capsule 25 mg  25 mg Oral Daily Nira Conn A, NP      . famotidine (PEPCID) tablet 20 mg  20 mg Oral BID Nira Conn A, NP   20 mg at 10/15/19 0900  . FLUoxetine (PROZAC) capsule 10 mg  10 mg Oral Daily Aldean Baker, NP   10 mg at 10/15/19 0900  . hydrOXYzine (ATARAX/VISTARIL) tablet 25 mg  25 mg Oral Q6H PRN Nira Conn A, NP      . loperamide (IMODIUM) capsule 2-4 mg  2-4 mg Oral PRN Nira Conn A, NP      . magnesium hydroxide (MILK OF MAGNESIA) suspension 30 mL  30 mL Oral Daily PRN Nira Conn A, NP      . multivitamin with minerals tablet 1 tablet  1 tablet Oral Daily Nira Conn A, NP   1 tablet at 10/15/19 0900  . nicotine (NICODERM CQ - dosed in  mg/24 hr) patch 7 mg  7 mg Transdermal Daily Cobos, Fernando A, MD      . ondansetron (ZOFRAN-ODT) disintegrating tablet 4 mg  4 mg Oral Q6H PRN Nira Conn A, NP      . thiamine (VITAMIN B-1) tablet 100 mg  100 mg Oral Daily Nira Conn A, NP   100 mg at 10/15/19 0900  . traZODone (DESYREL) tablet 50 mg  50 mg Oral QHS PRN Jackelyn Poling, NP   50 mg at 10/14/19 2157  Lab Results:  Results for orders placed or performed during the hospital encounter of 10/13/19 (from the past 48 hour(s))  Hemoglobin A1c     Status: Abnormal   Collection Time: 10/14/19  6:30 AM  Result Value Ref Range   Hgb A1c MFr Bld 4.5 (L) 4.8 - 5.6 %    Comment: (NOTE) Pre diabetes:          5.7%-6.4% Diabetes:              >6.4% Glycemic control for   <7.0% adults with diabetes    Mean Plasma Glucose 82.45 mg/dL    Comment: Performed at [facility], [address] [place], [place] [address]  Lipid panel     Status: None   Collection Time: 10/14/19  6:30 AM  Result Value Ref Range   Cholesterol 187 0 - 200 mg/dL   Triglycerides 191 <478 mg/dL   HDL 99 >29 mg/dL   Total CHOL/HDL Ratio 1.9 RATIO   VLDL 21 0 - 40 mg/dL   LDL Cholesterol 67 0 - 99 mg/dL    Comment:        Total Cholesterol/HDL:CHD Risk Coronary Heart Disease Risk Table                     Men   Women  1/2 Average Risk   3.4   3.3  Average Risk       5.0   4.4  2 X Average Risk   9.6   7.1  3 X Average Risk  23.4   11.0        Use the calculated Patient Ratio above and the CHD Risk Table to determine the patient's CHD Risk.        ATP III CLASSIFICATION (LDL):  <100     mg/dL   Optimal  562-130  mg/dL   Near or Above                    Optimal  130-159  mg/dL   Borderline  865-784  mg/dL   High  >696     mg/dL   Very High Performed at [facility], [address] [place], [place] [address]   TSH     Status: None   Collection Time: 10/14/19  6:32 PM  Result Value Ref Range   TSH 2.038 0.350 - 4.500  uIU/mL    Comment: Performed by a 3rd Generation assay with a functional sensitivity of <=0.01 uIU/mL. Performed at [facility], [address], [place], [place] [address]     Blood Alcohol level:  Lab Results  Component Value Date   ETH 226 (H) 10/13/2019   ETH <5 06/10/2016    Metabolic Disorder Labs: Lab Results  Component Value Date   HGBA1C 4.5 (L) 10/14/2019   MPG 82.45 10/14/2019   No results found for: PROLACTIN Lab Results  Component Value Date   CHOL 187 10/14/2019   TRIG 104 10/14/2019   HDL 99 10/14/2019   CHOLHDL 1.9 10/14/2019   VLDL 21 10/14/2019   LDLCALC 67 10/14/2019    Physical Findings: AIMS:  , ,  ,  ,    CIWA:  CIWA-Ar Total: 0 COWS:     Musculoskeletal: Strength & Muscle Tone: within normal limits Gait & Station: normal Patient leans: N/A  Psychiatric Specialty Exam: Physical Exam  Constitutional: She is oriented to person, place, and time. She appears well-developed and well-nourished. No  distress.  HENT:  Head: Normocephalic and atraumatic.  Respiratory: Effort normal. No respiratory distress.  Musculoskeletal: Normal range of motion.  Neurological: She is oriented to person, place, and time.  Skin: She is not diaphoretic.    Review of Systems  Constitutional: Positive for chills. Negative for diaphoresis, fever, malaise/fatigue and weight loss.  Respiratory: Negative for cough and shortness of breath.   Gastrointestinal: Negative for diarrhea, nausea and vomiting.  Neurological: Negative for headaches.    Blood pressure 138/81, pulse 73, temperature 98.3 F (36.8 C), temperature source Oral, resp. rate 14, height 6' (1.829 m), weight 99.8 kg, SpO2 100 %.Body mass index is 29.84 kg/m.  General Appearance: Fairly Groomed  Eye Contact:  Good  Speech:  Clear and Coherent and Normal Rate  Volume:  Normal  Mood:  Anxious and Depressed  Affect:  Congruent and Depressed  Thought Process:  Coherent, Goal Directed,  Linear and Descriptions of Associations: Intact  Orientation:  Full (Time, Place, and Person)  Thought Content:  Logical  Suicidal Thoughts:  Denies  Homicidal Thoughts:  No  Memory:  Immediate;   Good Recent;   Good  Judgement:  Fair  Insight:  Fair  Psychomotor Activity:  Normal  Concentration:  Concentration: Good  Recall:  Good  Fund of Knowledge:  Good  Language:  Good  Akathisia:  Negative  Handed:  Right  AIMS (if indicated):     Assets:  Communication Skills Desire for Improvement Financial Resources/Insurance Housing Leisure Time Physical Health Transportation  ADL's:  Intact  Cognition:  WNL  Sleep:  Number of Hours: 6.75     Treatment Plan Summary: Daily contact with patient to assess and evaluate symptoms and progress in treatment and Medication management   Patient admitted to adult unit for stabilization and treatment.  Patient will participate in therapeutic milieu and group therapy.   Start Campral 666 mg TID WC for alcohol cravings ContinueProzac 10 mg PO daily for mood Continue Librium CIWA protocol Continue Vistaril 25 mg PO TID PRN anxiety Continue trazodone 50 mg PO QHS PRN insomnia Continue thiamine 100 mg PO daily for supplementation   Discharge planning  in progress.   Jackelyn PolingJason A Berry, NP 10/15/2019, 11:22 AM   Patient seen with Barbara CowerJason, NP and case discussed with treatment team. Patient reports some improvement although remains vaguely constricted and sad and affect.  Limited milieu participation thus far.  Denies SI and presents future oriented, interested in outpatient treatment following discharge.  Not currently interested in rehab Not currently presenting with significant withdrawal symptoms.  No psychomotor agitation or restlessness, no tremors or psychomotor agitation, vitals stable.  Does describe feeling vaguely "jittery".. Thus far tolerating medications well.  Agrees to Campral trial for alcohol cravings (creatinine clearance  143.6).  Thus far tolerating Prozac well. Plan-add Campral for alcohol cravings, continue Librium detox protocol, increase Prozac to 20 mg daily.  Sallyanne HaversF Cobos MD

## 2019-10-16 DIAGNOSIS — F332 Major depressive disorder, recurrent severe without psychotic features: Secondary | ICD-10-CM | POA: Diagnosis not present

## 2019-10-16 DIAGNOSIS — T1491XA Suicide attempt, initial encounter: Secondary | ICD-10-CM | POA: Diagnosis not present

## 2019-10-16 DIAGNOSIS — T5492XA Toxic effect of unspecified corrosive substance, intentional self-harm, initial encounter: Secondary | ICD-10-CM | POA: Diagnosis not present

## 2019-10-16 DIAGNOSIS — F1024 Alcohol dependence with alcohol-induced mood disorder: Secondary | ICD-10-CM | POA: Diagnosis not present

## 2019-10-16 NOTE — [facility] Group Notes (Signed)
[facility] Group Notes:  (Nursing/MHT/Case Management/Adjunct)  Date:  10/16/2019  Time:  10:00 AM  Type of Therapy:  Nurse Education  Participation Level:  Active  Participation Quality:  Appropriate, Attentive, Sharing and Supportive  Affect:  Anxious and Appropriate  Cognitive:  Alert and Appropriate  Insight:  Appropriate, Good and Improving  Engagement in Group:  Developing/Improving, Engaged, Improving and Supportive  Modes of Intervention:  Discussion, Education, Exploration, Socialization and Support  Summary of Progress/Problems: pt's discussed crisis management and how this related to past/current crisis they were/are facing. Pt's were guided through their suicide safety plan and how each section could aid in preventing another crisis from escalating. Pt's shared their resources to help others realize coping skills, support systems and resources that could be utilized. Pt was attentive and shared current stressors in their life that have aided to crisis. Pt identified ways to avoid these triggers in the future. Pt was appropriate.  Margaret Salas [name] 10/16/2019, 11:53 AM

## 2019-10-16 NOTE — Progress Notes (Addendum)
[facility] MD Progress Note  10/16/2019 10:13 AM Margaret Salas  MRN:  [id number]   Subjective:  "I am good, feeling better today."  Margaret Salas is a 23 y.o. female who has a history of depression and alcohol use disorder. She presented to the emergency department after impulsively drinking bleach in a suicide attempt. She immediately regretted the decision and induced vomiting. She reported feeling severely depressed for 4 to 5 months and drinking alcohol daily to help cope. BAL in the ED was 226.   Patient reports that she started out drinking on weekends with a friend which then progressed to drinking daily by herself. Patient states Patient reports that she used to play volleyball all the time but has lost interest over the past year and now only plays occasionally. States that drinking alcohol also interfered with her playing volleyball.   Patient reports that she is feeling better today. States that she is no longer having any "cold sweats" or tremors. Patient states that she is not always a "scoail" person so she has been time in her room "chilling." She was encouraged to participate in groups. She was started on Campral yesterday and she feels that she is tolerating well without any side effects. She denies any alcohol cravings today. Patient is interested in participating outpatient substance abuse treatment and AA after discharge.  Patient is on day 3 of tapering librium CIWA protocol. After discharge she plans to return to work and save money before retuning to college.   On evaluation patient is alert and oriented x 4, pleasant, and cooperative. Speech is clear and coherent, normal pace, normal volume.  Eye contact is good. Mood is depressed and affect is congruent with mood. She is goal directed and future oriented. Thought process is coherent and descriptions of association are intact.  Thought content is logical.Denies audiovisual hallucinations. No indication that patient is responding to  internal stimuli.  Denies suicidal ideations. Denies homicidal ideations.     Principal Problem: <principal problem not specified> Diagnosis: Active Problems:   Severe recurrent major depression without psychotic features ([place])   Alcohol dependence with alcohol-induced mood disorder ([place])  Total Time spent with patient: 20 minutes  Past Psychiatric History: History of depression with self-injurious behaviors (cutting) during middle school. History of binge drinking. Denies history of hospitalizations or suicide attempts. Denies prior psychotropic medication trials or mental health treatment.   Past Medical History: History reviewed. No pertinent past medical history. History reviewed. No pertinent surgical history. Family History: History reviewed. No pertinent family history. Family Psychiatric  History: History of alcohol use disorder in multiple relatives on maternal side. Mother with alcohol use disorder and still drinking heavily. Father with alcohol use disorder with 6 years of sobriety. Social History:  Social History   Substance and Sexual Activity  Alcohol Use Yes  . Alcohol/week: 10.0 standard drinks  . Types: 10 Shots of liquor per week     Social History   Substance and Sexual Activity  Drug Use No    Social History   Socioeconomic History  . Marital status: Single    Spouse name: Not on file  . Number of children: Not on file  . Years of education: Not on file  . Highest education level: Not on file  Occupational History  . Not on file  Social Needs  . Financial resource strain: Not on file  . Food insecurity    Worry: Not on file    Inability: Not on file  .  Transportation needs    Medical: Not on file    Non-medical: Not on file  Tobacco Use  . Smoking status: Never Smoker  . Smokeless tobacco: Never Used  Substance and Sexual Activity  . Alcohol use: Yes    Alcohol/week: 10.0 standard drinks    Types: 10 Shots of liquor per week  . Drug use: No   . Sexual activity: Not on file  Lifestyle  . Physical activity    Days per week: Not on file    Minutes per session: Not on file  . Stress: Not on file  Relationships  . Social Musicianconnections    Talks on phone: Not on file    Gets together: Not on file    Attends religious service: Not on file    Active member of club or organization: Not on file    Attends meetings of clubs or organizations: Not on file    Relationship status: Not on file  Other Topics Concern  . Not on file  Social History Narrative  . Not on file   Additional Social History:                         Sleep: Fair  Appetite:  Fair  Current Medications: Current Facility-Administered Medications  Medication Dose Route Frequency Provider Last Rate Last Dose  . acamprosate (CAMPRAL) tablet 666 mg  666 mg Oral TID WC Cobos, Rockey SituFernando A, MD   666 mg at 10/16/19 0639  . acetaminophen (TYLENOL) tablet 650 mg  650 mg Oral Q6H PRN Jackelyn PolingBerry, Jason A, NP      . alum & mag hydroxide-simeth (MAALOX/MYLANTA) 200-200-20 MG/5ML suspension 30 mL  30 mL Oral Q4H PRN Nira ConnBerry, Jason A, NP      . chlordiazePOXIDE (LIBRIUM) capsule 25 mg  25 mg Oral Q6H PRN Nira ConnBerry, Jason A, NP      . chlordiazePOXIDE (LIBRIUM) capsule 25 mg  25 mg Oral BH-qamhs Nira ConnBerry, Jason A, NP   25 mg at 10/16/19 16100828   Followed by  . [START ON 10/17/2019] chlordiazePOXIDE (LIBRIUM) capsule 25 mg  25 mg Oral Daily Nira ConnBerry, Jason A, NP      . famotidine (PEPCID) tablet 20 mg  20 mg Oral BID Nira ConnBerry, Jason A, NP   20 mg at 10/16/19 0827  . FLUoxetine (PROZAC) capsule 20 mg  20 mg Oral Daily Cobos, Rockey SituFernando A, MD   20 mg at 10/16/19 0826  . hydrOXYzine (ATARAX/VISTARIL) tablet 25 mg  25 mg Oral Q6H PRN Nira ConnBerry, Jason A, NP   25 mg at 10/15/19 2156  . loperamide (IMODIUM) capsule 2-4 mg  2-4 mg Oral PRN Nira ConnBerry, Jason A, NP      . magnesium hydroxide (MILK OF MAGNESIA) suspension 30 mL  30 mL Oral Daily PRN Nira ConnBerry, Jason A, NP      . multivitamin with minerals tablet 1  tablet  1 tablet Oral Daily Nira ConnBerry, Jason A, NP   1 tablet at 10/16/19 0825  . nicotine (NICODERM CQ - dosed in mg/24 hr) patch 7 mg  7 mg Transdermal Daily Cobos, Rockey SituFernando A, MD   7 mg at 10/16/19 0826  . ondansetron (ZOFRAN-ODT) disintegrating tablet 4 mg  4 mg Oral Q6H PRN Nira ConnBerry, Jason A, NP      . thiamine (VITAMIN B-1) tablet 100 mg  100 mg Oral Daily Nira ConnBerry, Jason A, NP   100 mg at 10/16/19 0826  . traZODone (DESYREL) tablet 50 mg  50 mg  Oral QHS PRN Jackelyn Poling, NP   50 mg at 10/15/19 2156    Lab Results:  Results for orders placed or performed during the hospital encounter of 10/13/19 (from the past 48 hour(s))  TSH     Status: None   Collection Time: 10/14/19  6:32 PM  Result Value Ref Range   TSH 2.038 0.350 - 4.500 uIU/mL    Comment: Performed by a 3rd Generation assay with a functional sensitivity of <=0.01 uIU/mL. Performed at [facility], [address], [place], [place] [address]     Blood Alcohol level:  Lab Results  Component Value Date   ETH 226 (H) 10/13/2019   ETH <5 06/10/2016    Metabolic Disorder Labs: Lab Results  Component Value Date   HGBA1C 4.5 (L) 10/14/2019   MPG 82.45 10/14/2019   No results found for: PROLACTIN Lab Results  Component Value Date   CHOL 187 10/14/2019   TRIG 104 10/14/2019   HDL 99 10/14/2019   CHOLHDL 1.9 10/14/2019   VLDL 21 10/14/2019   LDLCALC 67 10/14/2019    Physical Findings: AIMS: Facial and Oral Movements Muscles of Facial Expression: None, normal Lips and Perioral Area: None, normal Jaw: None, normal Tongue: None, normal,Extremity Movements Upper (arms, wrists, hands, fingers): None, normal Lower (legs, knees, ankles, toes): None, normal, Trunk Movements Neck, shoulders, hips: None, normal, Overall Severity Severity of abnormal movements (highest score from questions above): None, normal Incapacitation due to abnormal movements: None, normal Patient's awareness of abnormal movements (rate  only patient's report): No Awareness, Dental Status Current problems with teeth and/or dentures?: No Does patient usually wear dentures?: No  CIWA:  CIWA-Ar Total: 1 COWS:  COWS Total Score: 1  Musculoskeletal: Strength & Muscle Tone: within normal limits Gait & Station: normal Patient leans: N/A  Psychiatric Specialty Exam: Physical Exam  Constitutional: She is oriented to person, place, and time. She appears well-developed and well-nourished. No distress.  HENT:  Head: Normocephalic and atraumatic.  Respiratory: Effort normal. No respiratory distress.  Musculoskeletal: Normal range of motion.  Neurological: She is oriented to person, place, and time.  Skin: She is not diaphoretic.    Review of Systems  Constitutional: Negative for chills, diaphoresis, fever, malaise/fatigue and weight loss.  Respiratory: Negative for cough and shortness of breath.   Gastrointestinal: Negative for diarrhea, nausea and vomiting.  Neurological: Negative for headaches.    Blood pressure 119/71, pulse 87, temperature 98 F (36.7 C), resp. rate 20, height 6' (1.829 m), weight 99.8 kg, SpO2 100 %.Body mass index is 29.84 kg/m.  General Appearance: Fairly Groomed  Eye Contact:  Good  Speech:  Clear and Coherent and Normal Rate  Volume:  Normal  Mood:  Anxious and Depressed  Affect:  Congruent and Depressed  Thought Process:  Coherent, Goal Directed, Linear and Descriptions of Associations: Intact  Orientation:  Full (Time, Place, and Person)  Thought Content:  Logical  Suicidal Thoughts:  Denies  Homicidal Thoughts:  No  Memory:  Immediate;   Good Recent;   Good  Judgement:  Fair  Insight:  Fair  Psychomotor Activity:  Normal  Concentration:  Concentration: Good  Recall:  Good  Fund of Knowledge:  Good  Language:  Good  Akathisia:  Negative  Handed:  Right  AIMS (if indicated):     Assets:  Communication Skills Desire for Improvement Financial Resources/Insurance Housing Leisure  Time Physical Health Transportation  ADL's:  Intact  Cognition:  WNL  Sleep:  Number  of Hours: 6.25     Treatment Plan Summary: Daily contact with patient to assess and evaluate symptoms and progress in treatment and Medication management   Patient admitted to adult unit for stabilization and treatment.  Patient will participate in therapeutic milieu and group therapy.   Continue Campral 666 mg TID WC for alcohol cravings Continue Prozac 20 mg PO daily for mood Continue Librium CIWA protocol Continue Vistaril 25 mg PO TID PRN anxiety Continue trazodone 50 mg PO QHS PRN insomnia Continue thiamine 100 mg PO daily for supplementation   Discharge planning  in progress.   Jackelyn Poling, NP 10/16/2019, 10:13 AM   I have seen patient along with Barbara Cower, NP, and have also discussed case with treatment team. Patient reports partial improvement compared to admission.  States that today she is feeling "better". She does continue to present with a rather constricted/flat affect although this tends to improve as session progresses and smiles briefly at times appropriately. At this time denies suicidal ideations and presents future oriented, stating that she plans to return home at discharge and is thinking of outpatient treatment and AA participation, which has been encouraged.  Currently not interested in going to a rehab setting at discharge. Tolerating medications well-she is currently on Prozac and on Campral for alcohol use disorder/cravings. She is completing Librium detox , which has been uneventful thus far.  Currently her vitals are stable and she is not presenting with significant restlessness or tremors or diaphoresis. Plan is to continue current management (Librium detox protocol , Campral for alcohol use disorder.  Will increase Prozac to 20 mg daily for depression). Treatment team working on disposition planning options F Cobos,MD

## 2019-10-16 NOTE — Progress Notes (Signed)
D:  Patient's self inventory sheet, patient has fair sleep, sleep medication helpful.  Good appetite, low energy level, poor concentration.  Rated depression 5, hopeless 4, anxiety 6.  Withdrawals, tremors, chilling.  Denied SI.  Denied physical problems.  Denied physical pain.  Goal is talk to SW.  Talk to staff about problems. A:  Medications administered per MD orders.  Emotional support and encouragement given patient. R:  Denied SI and HI, contracts for safety.  Denied A/V hallucinations.  Safety maintained with 15 minute checks.

## 2019-10-16 NOTE — Progress Notes (Signed)
Patient ID: Margaret Salas, female   DOB: 28-Jan-1996, 23 y.o.   MRN: [id number] D: Patient calm and cooperative on approach. Pt reports she comes from family that drinks a lot. Pt stated her goal is to have her drinking under control so she can be able to drink occasionally without getting drunk. Pt attended evening wrap up group and Interacted appropriately with peers. Pt denies  SI/HI/AVH and pain.No behavioral issues noted.  A: Support and encouragement offered as needed to express needs. Medications administered as prescribed.  R: Patient is safe and cooperative on unit. Will continue to monitor  for safety and stability.

## 2019-10-17 DIAGNOSIS — F332 Major depressive disorder, recurrent severe without psychotic features: Secondary | ICD-10-CM | POA: Diagnosis not present

## 2019-10-17 DIAGNOSIS — F1024 Alcohol dependence with alcohol-induced mood disorder: Secondary | ICD-10-CM | POA: Diagnosis not present

## 2019-10-17 DIAGNOSIS — T5492XA Toxic effect of unspecified corrosive substance, intentional self-harm, initial encounter: Secondary | ICD-10-CM | POA: Diagnosis not present

## 2019-10-17 DIAGNOSIS — T1491XA Suicide attempt, initial encounter: Secondary | ICD-10-CM | POA: Diagnosis not present

## 2019-10-17 MED ORDER — FAMOTIDINE 20 MG PO TABS: 20.0000 mg | ORAL_TABLET | Freq: Two times a day (BID) | ORAL | 0 refills | Status: AC

## 2019-10-17 MED ORDER — HYDROXYZINE HCL 25 MG PO TABS: 25.0000 mg | ORAL_TABLET | Freq: Four times a day (QID) | ORAL | 0 refills | Status: AC | PRN

## 2019-10-17 MED ORDER — FLUOXETINE HCL 20 MG PO CAPS: 20.0000 mg | ORAL_CAPSULE | Freq: Every day | ORAL | 0 refills | Status: AC

## 2019-10-17 MED ORDER — ACAMPROSATE CALCIUM 333 MG PO TBEC: 666.0000 mg | DELAYED_RELEASE_TABLET | Freq: Three times a day (TID) | ORAL | 0 refills | Status: AC

## 2019-10-17 MED ORDER — TRAZODONE HCL 50 MG PO TABS: 50.0000 mg | ORAL_TABLET | Freq: Every evening | ORAL | 0 refills | Status: AC | PRN

## 2019-10-17 MED ORDER — NICOTINE 7 MG/24HR TD PT24: 7.0000 mg | MEDICATED_PATCH | Freq: Every day | TRANSDERMAL | 0 refills | Status: AC

## 2019-10-17 NOTE — Progress Notes (Signed)
Recreation Therapy Notes  Date:  10.3020 Time: 0930 Location: [address] Dayroom  Group Topic: Stress Management  Goal Area(s) Addresses:  Patient will identify positive stress management techniques. Patient will identify benefits of using stress management post d/c.  Behavioral Response:  Engaged  Intervention: Stress Management  Activity :  Meditation.  LRT introduced the stress management technique of meditation.  LRT played a meditation that focused on being resilient.  Patients were to listen and follow along as meditation played.  Education:  Stress Management, Discharge Planning.   Education Outcome: Acknowledges Education  Clinical Observations/Feedback: Pt  Attended and participated in group.    Victorino Sparrow, LRT/CTRS         Victorino Sparrow A 10/17/2019 11:34 AM

## 2019-10-17 NOTE — Progress Notes (Signed)
   10/16/19 1605  Psych Admission Type (Psych Patients Only)  Admission Status Voluntary  Psychosocial Assessment  Patient Complaints Anxiety  Eye Contact Fair  Facial Expression Flat  Affect Blunted;Depressed  Speech Logical/coherent  Interaction Guarded;Minimal  Motor Activity Other (Comment) (wdl)  Appearance/Hygiene Unremarkable  Behavior Characteristics Anxious  Mood Depressed;Anxious  Thought Process  Coherency WDL  Content WDL  Delusions None reported or observed  Perception WDL  Hallucination None reported or observed  Judgment Impaired  Confusion None  Danger to Self  Current suicidal ideation? Denies  Danger to Others  Danger to Others None reported or observed     10/16/19 1605  Psych Admission Type (Psych Patients Only)  Admission Status Voluntary  Psychosocial Assessment  Patient Complaints Anxiety  Eye Contact Fair  Facial Expression Flat  Affect Blunted;Depressed  Speech Logical/coherent  Interaction Guarded;Minimal  Motor Activity Other (Comment) (wdl)  Appearance/Hygiene Unremarkable  Behavior Characteristics Anxious  Mood Depressed;Anxious  Thought Process  Coherency WDL  Content WDL  Delusions None reported or observed  Perception WDL  Hallucination None reported or observed  Judgment Impaired  Confusion None  Danger to Self  Current suicidal ideation? Denies  Danger to Others  Danger to Others None reported or observed  D: Patient in dayroom interacting with peers. Pt reports she had a good day.  A: Medications administered as prescribed. Support and encouragement provided as needed.  R: Patient remains safe on the unit. Will continue to monitor for safety and stability.

## 2019-10-17 NOTE — Discharge Summary (Addendum)
Physician Discharge Summary Note  Patient:  Margaret Salas is an 23 y.o., female  MRN:  [id number]  DOB:  Mar 29, 1996  Patient phone:  [phone] (home)   Patient address:   [place] [address],   Total Time spent with patient: Greater than 30 minutes  Date of Admission:  10/13/2019  Date of Discharge: 10-17-19  Reason for Admission: Suicide attempt by drinking bleach.  Principal Problem: Alcohol dependence with alcohol-induced mood disorder [facility])  Discharge Diagnoses: Principal Problem:   Alcohol dependence with alcohol-induced mood disorder ([place]) Active Problems:   Severe recurrent major depression without psychotic features [facility])  Past Psychiatric History: Alcohol use disorder, MDD  Past Medical History: History reviewed. No pertinent past medical history. History reviewed. No pertinent surgical history. Family History: History reviewed. No pertinent family history.  Family Psychiatric  History: See H&P  Social History:  Social History   Substance and Sexual Activity  Alcohol Use Yes  . Alcohol/week: 10.0 standard drinks  . Types: 10 Shots of liquor per week     Social History   Substance and Sexual Activity  Drug Use No    Social History   Socioeconomic History  . Marital status: Single    Spouse name: Not on file  . Number of children: Not on file  . Years of education: Not on file  . Highest education level: Not on file  Occupational History  . Not on file  Social Needs  . Financial resource strain: Not on file  . Food insecurity    Worry: Not on file    Inability: Not on file  . Transportation needs    Medical: Not on file    Non-medical: Not on file  Tobacco Use  . Smoking status: Never Smoker  . Smokeless tobacco: Never Used  Substance and Sexual Activity  . Alcohol use: Yes    Alcohol/week: 10.0 standard drinks    Types: 10 Shots of liquor per week  . Drug use: No  . Sexual activity: Not on file  Lifestyle  .  Physical activity    Days per week: Not on file    Minutes per session: Not on file  . Stress: Not on file  Relationships  . Social Herbalist on phone: Not on file    Gets together: Not on file    Attends religious service: Not on file    Active member of club or organization: Not on file    Attends meetings of clubs or organizations: Not on file    Relationship status: Not on file  Other Topics Concern  . Not on file  Social History Narrative  . Not on file   Hospital Course: (Per Md's admission evaluation): 23, single, no children, lives with friend. Currently taking a year off college. Reports she recently quit her job. Patient presented to ED following suicide attempt by drinking bleach. States this attempt was impulsive, unplanned. Reports that immediately after ingesting it she regretted it and forced emesis. She told a friend, who called 911. States she has been depressed for several months and endorses neuro-vegetative symptoms as below.  Endorses neuro vegetative symptoms of depression- poor sleep, poor energy, intermittent suicidal ideations, anhedonia.  She also reports she has been drinking daily over the last several months, and has been drinking a pint of liquor per day. Admission UDS positive for cocaine, reports recent isolated , x 1, use and denies pattern of cocaine abuse. Admission BAL 226 ,  admission UDS (+) cocaine. No prior psychiatric admissions, no history of prior  suicide attempts ., no history of self cutting States she has never been on psychiatric medication in the past . Reports history of depression, which started several months ago. Denies history of psychosis, no clear history of hypomania or mania. Denies medical illnesses,  NKDA, vapes tobacco product . No history of WDL ( or other) seizures. Does report recent blackouts associated with alcohol. Was not taking any medications prior to admission. Family history is remarkable for alcohol use disorder  (mother, father, sister).  Margaret Salas was admitted to the [facility] hospital for worsening symptoms of depression & crisis management due to suicide attempt by drinking bleach due to worsening depression of several months. She was brought to the hospital for evaluation & treatments.  After the above admission assessment, Margaret Salas's presenting symptoms were identified. The medication regimen targeting those symptoms were discussed & initiated with her consent. She received, stabilized & discharged on the medications as listed on her discharge medication lists below. She presented no other pre-existing medical problems that required treatments. She was enrolled & participated in the group counseling sessions being offered & held on this unit. She learned coping skills..  During the course of her hospitalization, Margaret Salas's improvement was monitored by observation & her daily report of symptom reduction noted. Her emotional & mental status were monitored by the daily self-inventory reports completed by her & the clinical staff. She was evaluated daily by the treatment team for mood stability & plans for continued recovery after discharge. She was offered further treatment options upon discharge on an outpatient basis as noted below   Upon discharge, Margaret Salas was both mentally & medically stable. She denies suicidal/homicidal ideations, auditory/visual/tactile hallucinations, delusional thoughts or paranoia. She was able to engage in safety planning including plan to return to [facility] or contact emergency services if she feels unable to maintain her own safety or the safety of others. Pt had no further questions, comments, or concerns.  She left [facility] with all personal belongings in no apparent distress. Transportation per mother.  Physical Findings: AIMS: Facial and Oral Movements Muscles of Facial Expression: None, normal Lips and Perioral Area: None, normal Jaw: None, normal Tongue: None, normal,Extremity Movements Upper (arms,  wrists, hands, fingers): None, normal Lower (legs, knees, ankles, toes): None, normal, Trunk Movements Neck, shoulders, hips: None, normal, Overall Severity Severity of abnormal movements (highest score from questions above): None, normal Incapacitation due to abnormal movements: None, normal Patient's awareness of abnormal movements (rate only patient's report): No Awareness, Dental Status Current problems with teeth and/or dentures?: No Does patient usually wear dentures?: No  CIWA:  CIWA-Ar Total: 1 COWS:  COWS Total Score: 1  Musculoskeletal: Strength & Muscle Tone: within normal limits Gait & Station: normal Patient leans: N/A  Psychiatric Specialty Exam: Physical Exam  Nursing note and vitals reviewed. Constitutional: She is oriented to person, place, and time. She appears well-developed.  Cardiovascular: Normal rate.  Respiratory: Effort normal.  Genitourinary:    Genitourinary Comments: Deferred   Musculoskeletal: Normal range of motion.  Neurological: She is alert and oriented to person, place, and time.  Skin: Skin is warm and dry.    Review of Systems  Constitutional: Negative for chills and fever.  Respiratory: Negative for cough, shortness of breath and wheezing.   Cardiovascular: Negative for chest pain and palpitations.  Gastrointestinal: Negative for abdominal pain, heartburn, nausea and vomiting.  Skin: Negative.   Neurological: Negative for dizziness and headaches.  Psychiatric/Behavioral: Positive for depression (Stabilized with medication prior to discharge) and substance abuse (Hx. alcohol & cocaine use disorders). Negative for hallucinations, memory loss and suicidal ideas. The patient has insomnia (Stabilized with medication prior to discharge). The patient is not nervous/anxious (Stable).     Blood pressure 117/70, pulse (!) 101, temperature 97.9 F (36.6 C), temperature source Oral, resp. rate 20, height 6' (1.829 m), weight 99.8 kg, SpO2 100 %.Body mass  index is 29.84 kg/m.  See Md's discharge SRA   Have you used any form of tobacco in the last 30 days? (Cigarettes, Smokeless Tobacco, Cigars, and/or Pipes): Yes  Has this patient used any form of tobacco in the last 30 days? (Cigarettes, Smokeless Tobacco, Cigars, and/or Pipes): N/A  Blood Alcohol level:  Lab Results  Component Value Date   ETH 226 (H) 10/13/2019   ETH <5 06/10/2016   Metabolic Disorder Labs:  Lab Results  Component Value Date   HGBA1C 4.5 (L) 10/14/2019   MPG 82.45 10/14/2019   No results found for: PROLACTIN Lab Results  Component Value Date   CHOL 187 10/14/2019   TRIG 104 10/14/2019   HDL 99 10/14/2019   CHOLHDL 1.9 10/14/2019   VLDL 21 10/14/2019   LDLCALC 67 10/14/2019   See Psychiatric Specialty Exam and Suicide Risk Assessment completed by Attending Physician prior to discharge.  Discharge destination:  Home  Is patient on multiple antipsychotic therapies at discharge:  No   Has Patient had three or more failed trials of antipsychotic monotherapy by history:  No  Recommended Plan for Multiple Antipsychotic Therapies: NA  Allergies as of 10/17/2019   No Known Allergies     Medication List    TAKE these medications     Indication  acamprosate 333 MG tablet Commonly known as: CAMPRAL Take 2 tablets (666 mg total) by mouth 3 (three) times daily with meals. For alcoholism  Indication: Abuse or Misuse of Alcohol   famotidine 20 MG tablet Commonly known as: PEPCID Take 1 tablet (20 mg total) by mouth 2 (two) times daily. For acid reflux  Indication: Gastroesophageal Reflux Disease   FLUoxetine 20 MG capsule Commonly known as: PROZAC Take 1 capsule (20 mg total) by mouth daily. For depression Start taking on: October 18, 2019  Indication: Major Depressive Disorder   hydrOXYzine 25 MG tablet Commonly known as: ATARAX/VISTARIL Take 1 tablet (25 mg total) by mouth every 6 (six) hours as needed for anxiety.  Indication: Feeling Anxious    Nexplanon 68 MG Impl implant Generic drug: etonogestrel Inject 1 each into the skin once.  Indication: Birth Control Treatment   nicotine 7 mg/24hr patch Commonly known as: NICODERM CQ - dosed in mg/24 hr Place 1 patch (7 mg total) onto the skin daily. (May buy from OTC): For smoking cessation Start taking on: October 18, 2019  Indication: Nicotine Addiction   traZODone 50 MG tablet Commonly known as: DESYREL Take 1 tablet (50 mg total) by mouth at bedtime as needed for sleep.  Indication: Trouble Sleeping      Follow-up Information    [facility] on 10/23/2019.   Why: Medication management with Fransisca Kaufmann on Thursday 11/5 at 10:30a.  Please call office within 24 hours of discharge to confirm appt and complete intake paperwork.  Contact information: [address] [place] [address] [phone]          Follow-up recommendations: Activity:  As tolerated Diet: As recommended by your primary care doctor. Keep all  scheduled follow-up appointments as recommended.   Comments: Prescriptions given at discharge.  Patient agreeable to plan.  Given opportunity to ask questions.  Appears to feel comfortable with discharge denies any current suicidal or homicidal thought. Patient is also instructed prior to discharge to: Take all medications as prescribed by his/her mental healthcare provider. Report any adverse effects and or reactions from the medicines to his/her outpatient provider promptly. Patient has been instructed & cautioned: To not engage in alcohol and or illegal drug use while on prescription medicines. In the event of worsening symptoms, patient is instructed to call the crisis hotline, 911 and or go to the nearest ED for appropriate evaluation and treatment of symptoms. To follow-up with his/her primary care provider for your other medical issues, concerns and or health care needs.  Signed: Armandina StammerAgnes Nwoko, NP, PMHNP, FNP-BC 10/17/2019, 11:28  AM   Patient seen, Suicide Assessment Completed.  Disposition Plan Reviewed

## 2019-10-17 NOTE — BHH Suicide Risk Assessment (Signed)
[facility] Discharge Suicide Risk Assessment   Principal Problem: Alcohol dependence with alcohol-induced mood disorder [facility]) Discharge Diagnoses: Principal Problem:   Alcohol dependence with alcohol-induced mood disorder ([place]) Active Problems:   Severe recurrent major depression without psychotic features ([place])   Total Time spent with patient: 30 minutes  Musculoskeletal: Strength & Muscle Tone: within normal limits no current tremors or diaphoresis, no acute distress or restlessness, presents calm and comfortable Gait & Station: normal Patient leans: N/A  Psychiatric Specialty Exam: ROS no headache, no chest pain, no shortness of breath, no cough, no vomiting  Blood pressure 117/70, pulse (!) 101, temperature 97.9 F (36.6 C), temperature source Oral, resp. rate 20, height 6' (1.829 m), weight 99.8 kg, SpO2 100 %.Body mass index is 29.84 kg/m.  General Appearance: Improving grooming  Eye Contact::  Good  Speech:  Normal Rate409  Volume:  Normal  Mood:  Reports mood is improved, feeling "better"  Affect:  Less constricted, smiles at times appropriately during session  Thought Process:  Linear and Descriptions of Associations: Intact  Orientation:  Full (Time, Place, and Person)  Thought Content:  No hallucinations, no delusions  Suicidal Thoughts:  No currently denies suicidal ideations, denies homicidal or violent ideations  Homicidal Thoughts:  No  Memory:  Recent and remote grossly intact  Judgement:  Other:  Improving  Insight:  Improving  Psychomotor Activity:  Normal  Concentration:  Good  Recall:  Good  Fund of Knowledge:Good  Language: Good  Akathisia:  Negative  Handed:  Right  AIMS (if indicated):     Assets:  Desire for Improvement Resilience  Sleep:  Number of Hours: 6  Cognition: WNL  ADL's:  Intact   Mental Status Per Nursing Assessment::   On Admission:  NA  Demographic Factors:  23 year old single female, no children, lives with friend.   Loss  Factors: Recent job loss, alcohol abuse  Historical Factors: Alcohol use disorder, no prior psychiatric admissions, history of depression. Patient reports that depression worsened significantly in the context of regular alcohol consumption.  Risk Reduction Factors:   Sense of responsibility to family, Living with another person, especially a relative, Positive social support and Positive coping skills or problem solving skills  Continued Clinical Symptoms:  Today patient presents alert, attentive, calm, pleasant on approach, currently denies significant or residual symptoms of alcohol withdrawal and does not appear to be in any acute distress.  No thought disorder.  Describes mood is improved, affect is less constricted and smiles at times during session appropriately.  Not suicidal, not homicidal, no psychotic symptoms. Behavior on unit good control, interacting appropriately with peers, pleasant on approach Thus far tolerating medications well, denies side effects, we reviewed side effect profile to include potential risk of increased suicidal ideations early in treatment with antidepressants in young adults. With patient's expressed consent I spoke with her mother who has visited patient on unit, mother corroborates that patient seems much improved and is in agreement with discharge today.  Cognitive Features That Contribute To Risk:  No gross cognitive deficits noted upon discharge. Is alert , attentive, and oriented x 3   Suicide Risk:  Mild:  Suicidal ideation of limited frequency, intensity, duration, and specificity.  There are no identifiable plans, no associated intent, mild dysphoria and related symptoms, good self-control (both objective and subjective assessment), few other risk factors, and identifiable protective factors, including available and accessible social support.  Follow-up [place] Follow up on 10/23/2019.   Why:  Medication management with  Fransisca Kaufmann on Thursday 11/5 at 10:30a.  Please call office within 24 hours of discharge to confirm appt and complete intake paperwork.  Contact information: [address] [place] [address] [phone]        [facility] Follow up.   Why: Please contact clinic for outpatient therapy.  Contact information: [address] [place] [address] ph: [phone] fx: [phone]          Plan Of Care/Follow-up recommendations:  Activity:  As tolerated Diet:  Regular Tests:  NA Other:  See below  Patient is expressing readiness for discharge and is leaving unit in good spirits  Plans to return home, lives next door to her mother, who is going to pick her up later today Follow up as above. Patient also reports she plans to go to AA meetings regularly.  Craige Cotta, MD 10/17/2019, 1:00 PM

## 2019-10-17 NOTE — Progress Notes (Signed)
Pt discharged to lobby. Pt was stable and appreciative at that time. All papers and prescriptions were given and valuables returned. Verbal understanding expressed. Denies SI/HI and A/VH. Pt given opportunity to express concerns and ask questions.  

## 2019-10-17 NOTE — Progress Notes (Signed)
Adult Psychoeducational Group Note  Date:  10/17/2019 Time:  1:10 AM  Group Topic/Focus:  Wrap-Up Group:   The focus of this group is to help patients review their daily goal of treatment and discuss progress on daily workbooks.  Participation Level:  Active  Participation Quality:  Appropriate  Affect:  Appropriate  Cognitive:  Appropriate  Insight: Appropriate  Engagement in Group:  Engaged  Modes of Intervention:  Discussion  Additional Comments:  Pt said her day was a 72. The one positive thing that happen to her today she went to the gym. It was great to get off the unit. She was able to visit with her mother.  Lenice Llamas Long 10/17/2019, 1:10 AM

## 2019-10-17 NOTE — Progress Notes (Signed)
  [facility] Adult Case Management Discharge Plan :  Will you be returning to the same living situation after discharge:  Yes,  home. At discharge, do you have transportation home?: Yes,  mother will pick up after 1pm. Do you have the ability to pay for your medications: Yes,  [facility] insurance  Release of information consent forms completed and in the chart.  Patient to Follow up at: Follow-up [place] Follow up on 10/23/2019.   Why: Medication management with Elmarie Shiley on Thursday 11/5 at 10:30a.  Please call office within 24 hours of discharge to confirm appt and complete intake paperwork.  Contact information: [place]  [address] [phone]           Next level of care provider has access to [place] and Suicide Prevention discussed: Yes,  with patient.  Have you used any form of tobacco in the last 30 days? (Cigarettes, Smokeless Tobacco, Cigars, and/or Pipes): Yes  Has patient been referred to the Quitline?: Patient refused referral  Patient has been referred for addiction treatment: Yes  Joellen Jersey, [place] 10/17/2019, 11:48 AM
